# Patient Record
Sex: Female | Born: 1940 | Race: White | Hispanic: No | Smoking: Former smoker
Health system: Southern US, Community
[De-identification: ages and names within clinical notes are randomized; demographics above are authoritative.]

---

## 2002-11-23 ENCOUNTER — Encounter: Admission: RE | Admit: 2002-11-23 | Discharge: 2002-11-23 | Payer: Self-pay | Admitting: Neurosurgery

## 2002-12-08 ENCOUNTER — Encounter: Admission: RE | Admit: 2002-12-08 | Discharge: 2002-12-08 | Payer: Self-pay | Admitting: Neurosurgery

## 2011-11-10 DIAGNOSIS — I679 Cerebrovascular disease, unspecified: Secondary | ICD-10-CM | POA: Insufficient documentation

## 2011-11-11 DIAGNOSIS — E11319 Type 2 diabetes mellitus with unspecified diabetic retinopathy without macular edema: Secondary | ICD-10-CM | POA: Insufficient documentation

## 2012-05-12 DIAGNOSIS — F3342 Major depressive disorder, recurrent, in full remission: Secondary | ICD-10-CM | POA: Insufficient documentation

## 2012-12-24 DIAGNOSIS — M129 Arthropathy, unspecified: Secondary | ICD-10-CM | POA: Insufficient documentation

## 2013-02-06 DIAGNOSIS — E113299 Type 2 diabetes mellitus with mild nonproliferative diabetic retinopathy without macular edema, unspecified eye: Secondary | ICD-10-CM | POA: Insufficient documentation

## 2013-03-31 DIAGNOSIS — I350 Nonrheumatic aortic (valve) stenosis: Secondary | ICD-10-CM | POA: Insufficient documentation

## 2013-03-31 DIAGNOSIS — Z951 Presence of aortocoronary bypass graft: Secondary | ICD-10-CM | POA: Insufficient documentation

## 2013-09-04 DIAGNOSIS — D126 Benign neoplasm of colon, unspecified: Secondary | ICD-10-CM | POA: Insufficient documentation

## 2013-11-13 DIAGNOSIS — J302 Other seasonal allergic rhinitis: Secondary | ICD-10-CM | POA: Insufficient documentation

## 2013-11-13 DIAGNOSIS — J3089 Other allergic rhinitis: Secondary | ICD-10-CM

## 2015-01-15 DIAGNOSIS — IMO0002 Reserved for concepts with insufficient information to code with codable children: Secondary | ICD-10-CM | POA: Insufficient documentation

## 2015-01-15 DIAGNOSIS — G4733 Obstructive sleep apnea (adult) (pediatric): Secondary | ICD-10-CM | POA: Insufficient documentation

## 2015-05-11 DIAGNOSIS — R0989 Other specified symptoms and signs involving the circulatory and respiratory systems: Secondary | ICD-10-CM | POA: Insufficient documentation

## 2015-05-11 DIAGNOSIS — N200 Calculus of kidney: Secondary | ICD-10-CM | POA: Insufficient documentation

## 2015-05-11 DIAGNOSIS — F411 Generalized anxiety disorder: Secondary | ICD-10-CM | POA: Insufficient documentation

## 2015-05-11 DIAGNOSIS — K449 Diaphragmatic hernia without obstruction or gangrene: Secondary | ICD-10-CM

## 2015-05-11 DIAGNOSIS — K219 Gastro-esophageal reflux disease without esophagitis: Secondary | ICD-10-CM | POA: Insufficient documentation

## 2015-05-11 DIAGNOSIS — E782 Mixed hyperlipidemia: Secondary | ICD-10-CM | POA: Insufficient documentation

## 2015-05-13 DIAGNOSIS — K7460 Unspecified cirrhosis of liver: Secondary | ICD-10-CM | POA: Insufficient documentation

## 2015-05-24 DIAGNOSIS — C8330 Diffuse large B-cell lymphoma, unspecified site: Secondary | ICD-10-CM | POA: Insufficient documentation

## 2015-05-26 DIAGNOSIS — I480 Paroxysmal atrial fibrillation: Secondary | ICD-10-CM | POA: Insufficient documentation

## 2015-06-25 DIAGNOSIS — I5032 Chronic diastolic (congestive) heart failure: Secondary | ICD-10-CM | POA: Insufficient documentation

## 2016-06-30 DIAGNOSIS — R32 Unspecified urinary incontinence: Secondary | ICD-10-CM | POA: Insufficient documentation

## 2016-09-22 DIAGNOSIS — M818 Other osteoporosis without current pathological fracture: Secondary | ICD-10-CM | POA: Insufficient documentation

## 2017-04-11 DIAGNOSIS — E039 Hypothyroidism, unspecified: Secondary | ICD-10-CM | POA: Insufficient documentation

## 2017-04-21 ENCOUNTER — Encounter: Payer: Self-pay | Admitting: Family Medicine

## 2017-04-21 ENCOUNTER — Ambulatory Visit (INDEPENDENT_AMBULATORY_CARE_PROVIDER_SITE_OTHER): Payer: Medicare Other | Admitting: Family Medicine

## 2017-04-21 DIAGNOSIS — M545 Low back pain, unspecified: Secondary | ICD-10-CM

## 2017-04-21 DIAGNOSIS — M25551 Pain in right hip: Secondary | ICD-10-CM

## 2017-04-21 MED ORDER — METHYLPREDNISOLONE ACETATE 40 MG/ML IJ SUSP
40.0000 mg | Freq: Once | INTRAMUSCULAR | Status: AC
  Administered 2017-04-21: 40 mg via INTRAMUSCULAR

## 2017-04-21 NOTE — Patient Instructions (Signed)
I'm concerned you have an irritated nerve in your back going to your hip muscles. Our options are limited in what can be done to treat this. We gave you an injection today - expect your blood sugars to be elevated for the next week. We will also set up home physical therapy for you. Typically we consider an MRI of your back if not improving though I know you're very claustrophobic. Follow up with me about 1 month after starting therapy typically but you can call me sooner if you're struggling.

## 2017-04-22 DIAGNOSIS — M545 Low back pain, unspecified: Secondary | ICD-10-CM | POA: Insufficient documentation

## 2017-04-22 DIAGNOSIS — I10 Essential (primary) hypertension: Secondary | ICD-10-CM | POA: Insufficient documentation

## 2017-04-22 DIAGNOSIS — I6529 Occlusion and stenosis of unspecified carotid artery: Secondary | ICD-10-CM | POA: Insufficient documentation

## 2017-04-22 DIAGNOSIS — I739 Peripheral vascular disease, unspecified: Secondary | ICD-10-CM | POA: Insufficient documentation

## 2017-04-22 DIAGNOSIS — E669 Obesity, unspecified: Secondary | ICD-10-CM | POA: Insufficient documentation

## 2017-04-22 NOTE — Progress Notes (Signed)
PCP: Janett Billow, MD  Subjective:   HPI: Patient is a 76 y.o. female here for right hip pain.  Patient denies known injury or trauma. She states for the past 3 weeks she's had fairly severe posterior right hip, low back pain. Pain with walking, standing, and sitting. Only relief when laying flat with heating pad. Tried low dose muscle relaxant and 2 extra strength tylenol four times a day. Feels this started when she stood up after making the bed, turned and hip felt like it was on fire. Has known hip arthritis. Unfortunately cannot take many medications including prednisone - when takes this blood sugar goes up significantly and has trouble sleeping. No numbness or tingling. No bowel/bladder dysfunction.  No past medical history on file.  No current outpatient prescriptions on file prior to visit.   No current facility-administered medications on file prior to visit.     No past surgical history on file.  Allergies  Allergen Reactions  . Lisinopril Other (See Comments)    BAD COUGH  . Niacin And Related Other (See Comments)    Burning sensation  . Codeine Nausea And Vomiting  . Hydrocodone-Acetaminophen Nausea Only  . Tape Other (See Comments)    Skin peeling ---PAPER TAPE ONLY PLEASE  . Oxycodone-Acetaminophen Nausea And Vomiting  . Procaine Nausea Only and Palpitations    Heart racing-can use Lidocaine    Social History   Social History  . Marital status: Married    Spouse name: N/A  . Number of children: N/A  . Years of education: N/A   Occupational History  . Not on file.   Social History Main Topics  . Smoking status: Former Research scientist (life sciences)  . Smokeless tobacco: Never Used  . Alcohol use Not on file  . Drug use: Unknown  . Sexual activity: Not on file   Other Topics Concern  . Not on file   Social History Narrative  . No narrative on file    No family history on file.  BP 121/80   Pulse 67   Ht 5\' 3"  (1.6 m)   Wt 238 lb (108 kg)   BMI 42.16  kg/m   Review of Systems: See HPI above.     Objective:  Physical Exam:  Gen: NAD, comfortable in exam room seated in chair.  Back/right hip: No gross deformity, scoliosis. TTP right buttock, over hip external rotators.  No SI joint or midline back tenderness.  Minimal proximal IT band tenderness. FROM with pain on flexion of back. Strength 4/5 with hip flexion and knee extension on right.  5/5 all other muscle groups.   1+ MSRs in patellar and achilles tendons, equal bilaterally. Positive SLR on right, negative left. Sensation intact to light touch bilaterally. Negative logroll bilateral hips   Assessment & Plan:  1. Low back/right hip pain - We had a discussion regarding possibilities and unfortunately this fits most with lumbar radiculopathy given location, severity, and otherwise normal exam aside from straight leg raises and proximal lower leg weakness.  She is limited in what she can do for this condition.  Encouraged to continue with the tylenol and muscle relaxant.  We also discussed IM injection and that this would increase her blood sugars over about 7+ days - she is aware and wanted to go ahead with this.  Will order home health PT for her as well.  Consider MRI but she reports very claustrophobic.  F/u in 1 month.  After informed written consent timeout was performed.  Alcohol swab to prep right upper outer quadrant of glut then injected with 1:1 bupivicaine: depomedrol intramuscularly.  Patient tolerated procedure well without immediate complications.

## 2017-04-22 NOTE — Assessment & Plan Note (Signed)
We had a discussion regarding possibilities and unfortunately this fits most with lumbar radiculopathy given location, severity, and otherwise normal exam aside from straight leg raises and proximal lower leg weakness.  She is limited in what she can do for this condition.  Encouraged to continue with the tylenol and muscle relaxant.  We also discussed IM injection and that this would increase her blood sugars over about 7+ days - she is aware and wanted to go ahead with this.  Will order home health PT for her as well.  Consider MRI but she reports very claustrophobic.  F/u in 1 month.  After informed written consent timeout was performed.  Alcohol swab to prep right upper outer quadrant of glut then injected with 1:1 bupivicaine: depomedrol intramuscularly.  Patient tolerated procedure well without immediate complications.

## 2017-04-28 ENCOUNTER — Telehealth: Payer: Self-pay | Admitting: Family Medicine

## 2017-04-28 NOTE — Telephone Encounter (Signed)
Informed Sandra Poole that plan of care is approved per provider

## 2017-04-28 NOTE — Telephone Encounter (Signed)
Ok to approve this.  Thanks!

## 2017-04-28 NOTE — Telephone Encounter (Signed)
Requesting a verbal order for physical therapy plan of care:  1x a week for 1wk, 2x a week for 2wks, and then 1x a week for 1wk

## 2017-05-24 ENCOUNTER — Ambulatory Visit: Payer: Medicare Other | Admitting: Family Medicine

## 2017-05-24 ENCOUNTER — Encounter: Payer: Self-pay | Admitting: Family Medicine

## 2017-05-24 DIAGNOSIS — M545 Low back pain, unspecified: Secondary | ICD-10-CM

## 2017-05-24 NOTE — Patient Instructions (Signed)
Call me if you need anything! I'm glad you're doing better!

## 2017-05-25 ENCOUNTER — Encounter: Payer: Self-pay | Admitting: Family Medicine

## 2017-05-25 NOTE — Assessment & Plan Note (Signed)
Consistent with lumbar radiculopathy.  Improved with IM injection, home PT and exercises.  Encouraged to do home exercises still.  Tylenol if needed.  F/u prn.

## 2017-05-25 NOTE — Progress Notes (Signed)
PCP: Janett Billow, MD  Subjective:   HPI: Patient is a 76 y.o. female here for right hip pain.  10/3: Patient denies known injury or trauma. She states for the past 3 weeks she's had fairly severe posterior right hip, low back pain. Pain with walking, standing, and sitting. Only relief when laying flat with heating pad. Tried low dose muscle relaxant and 2 extra strength tylenol four times a day. Feels this started when she stood up after making the bed, turned and hip felt like it was on fire. Has known hip arthritis. Unfortunately cannot take many medications including prednisone - when takes this blood sugar goes up significantly and has trouble sleeping. No numbness or tingling. No bowel/bladder dysfunction.  11/5: Patient reports she feels much better following 2-3 days after injection. Did 2 visits of home physical therapy and home exercises then advised PT he did not have to come in any more due to her improvement. No numbness. No pain currently.  History reviewed. No pertinent past medical history.  Current Outpatient Medications on File Prior to Visit  Medication Sig Dispense Refill  . ALPRAZolam (XANAX) 0.5 MG tablet     . atorvastatin (LIPITOR) 80 MG tablet     . buPROPion (WELLBUTRIN XL) 150 MG 24 hr tablet     . cloNIDine (CATAPRES) 0.2 MG tablet     . furosemide (LASIX) 40 MG tablet     . HUMALOG 100 UNIT/ML injection     . levothyroxine (SYNTHROID, LEVOTHROID) 50 MCG tablet     . losartan (COZAAR) 100 MG tablet     . metFORMIN (GLUCOPHAGE) 500 MG tablet     . metoprolol succinate (TOPROL-XL) 100 MG 24 hr tablet     . oxybutynin (DITROPAN) 5 MG tablet     . pantoprazole (PROTONIX) 40 MG tablet     . sertraline (ZOLOFT) 50 MG tablet     . tiZANidine (ZANAFLEX) 4 MG tablet     . XARELTO 20 MG TABS tablet      No current facility-administered medications on file prior to visit.     History reviewed. No pertinent surgical history.  Allergies  Allergen  Reactions  . Lisinopril Other (See Comments)    BAD COUGH  . Niacin And Related Other (See Comments)    Burning sensation  . Codeine Nausea And Vomiting  . Hydrocodone-Acetaminophen Nausea Only  . Tape Other (See Comments)    Skin peeling ---PAPER TAPE ONLY PLEASE  . Oxycodone-Acetaminophen Nausea And Vomiting  . Procaine Nausea Only and Palpitations    Heart racing-can use Lidocaine    Social History   Socioeconomic History  . Marital status: Married    Spouse name: Not on file  . Number of children: Not on file  . Years of education: Not on file  . Highest education level: Not on file  Social Needs  . Financial resource strain: Not on file  . Food insecurity - worry: Not on file  . Food insecurity - inability: Not on file  . Transportation needs - medical: Not on file  . Transportation needs - non-medical: Not on file  Occupational History  . Not on file  Tobacco Use  . Smoking status: Former Research scientist (life sciences)  . Smokeless tobacco: Never Used  Substance and Sexual Activity  . Alcohol use: Not on file  . Drug use: Not on file  . Sexual activity: Not on file  Other Topics Concern  . Not on file  Social History Narrative  .  Not on file    History reviewed. No pertinent family history.  BP 119/83   Pulse 71   Ht 5\' 3"  (1.6 m)   Wt 235 lb (106.6 kg)   BMI 41.63 kg/m   Review of Systems: See HPI above.     Objective:  Physical Exam:  Gen: NAD, seated in wheelchair.  Back/right hip: No gross deformity, scoliosis. No TTP back, buttock, or hip.  No midline or bony TTP. FROM without pain. Strength LEs 5/5 all muscle groups.   Negative SLRs. Sensation intact to light touch bilaterally. Negative logroll bilateral hips   Assessment & Plan:  1. Low back/right hip pain - Consistent with lumbar radiculopathy.  Improved with IM injection, home PT and exercises.  Encouraged to do home exercises still.  Tylenol if needed.  F/u prn.

## 2021-12-03 ENCOUNTER — Ambulatory Visit: Payer: Medicare Other | Admitting: Family Medicine

## 2021-12-03 ENCOUNTER — Encounter: Payer: Self-pay | Admitting: Family Medicine

## 2021-12-03 VITALS — BP 140/41 | Ht 62.0 in | Wt 209.0 lb

## 2021-12-03 DIAGNOSIS — M5416 Radiculopathy, lumbar region: Secondary | ICD-10-CM | POA: Diagnosis not present

## 2021-12-03 MED ORDER — OXYCODONE-ACETAMINOPHEN 5-325 MG PO TABS: 1.0000 | ORAL_TABLET | Freq: Four times a day (QID) | ORAL | 0 refills | Status: DC | PRN

## 2021-12-03 MED ORDER — DIAZEPAM 5 MG PO TABS: ORAL_TABLET | ORAL | 0 refills | Status: AC

## 2021-12-03 MED ORDER — PREDNISONE 10 MG PO TABS: ORAL_TABLET | ORAL | 0 refills | Status: AC

## 2021-12-03 NOTE — Patient Instructions (Addendum)
You have lumbar radiculopathy (a pinched nerve in your low back). ?Don't take tylenol with your oxycodone (this has tylenol in it). ?Take prednisone dose pack as directed. ?Oxycodone as needed for severe pain (no driving on this medicine). ?We will order an MRI of your lumbar spine - take valium 45 minutes prior to this, can repeat once more about 15 minutes before if you still feel anxious. ?Stay as active as possible. ?Physical therapy has been shown to be helpful as well but I don't think you would tolerate this right now. ?I will call you with the MRI results and next steps. ? ? ?[facility] ?[address], [place] [address] ?Phone: [phone] ? ?Please call them to schedule your MRI. ?

## 2021-12-03 NOTE — Progress Notes (Signed)
PCP: Janett Billow, MD ? ?Subjective:  ? ?HPI: ?Patient is a 81 y.o. female here for right leg pain. ? ?Patient reports she started to get pain in right posterior hip radiating down right leg in December. ?She was given shot of steroid, oral steroids, and percocet as needed. ?She did well following this for about a month then pain has slowly worsened since then. ?Pain sharp and radiates from posterior right hip wrapping around thigh to anterior knee down leg to all digits. ?Associated numbness, tingling like foot is asleep. ?No changes in bowel/bladder function. ?Difficulty lifting leg up off the chair. ? ?History reviewed. No pertinent past medical history. ? ?Current Outpatient Medications on File Prior to Visit  ?Medication Sig Dispense Refill  ? ALPRAZolam (XANAX) 0.5 MG tablet     ? atorvastatin (LIPITOR) 80 MG tablet     ? buPROPion (WELLBUTRIN XL) 150 MG 24 hr tablet     ? cloNIDine (CATAPRES) 0.2 MG tablet     ? furosemide (LASIX) 40 MG tablet     ? HUMALOG 100 UNIT/ML injection     ? levothyroxine (SYNTHROID, LEVOTHROID) 50 MCG tablet     ? losartan (COZAAR) 100 MG tablet     ? metFORMIN (GLUCOPHAGE) 500 MG tablet     ? metoprolol succinate (TOPROL-XL) 100 MG 24 hr tablet     ? oxybutynin (DITROPAN) 5 MG tablet     ? pantoprazole (PROTONIX) 40 MG tablet     ? sertraline (ZOLOFT) 50 MG tablet     ? tiZANidine (ZANAFLEX) 4 MG tablet     ? XARELTO 20 MG TABS tablet     ? ?No current facility-administered medications on file prior to visit.  ? ? ?History reviewed. No pertinent surgical history. ? ?Allergies  ?Allergen Reactions  ? Lisinopril Other (See Comments)  ?  BAD COUGH  ? Niacin And Related Other (See Comments)  ?  Burning sensation  ? Codeine Nausea And Vomiting  ? Hydrocodone-Acetaminophen Nausea Only  ? Tape Other (See Comments)  ?  Skin peeling ---PAPER TAPE ONLY PLEASE  ? Oxycodone-Acetaminophen Nausea And Vomiting  ? Procaine Nausea Only and Palpitations  ?  Heart racing-can use Lidocaine   ? ? ?BP (!) 140/41   Ht '5\' 2"'$  (1.575 m)   Wt 209 lb (94.8 kg)   BMI 38.23 kg/m?  ? ?   ? View : No data to display.  ?  ?  ?  ? ? ?   ? View : No data to display.  ?  ?  ?  ? ? ?    ?Objective:  ?Physical Exam: ? ?Gen: NAD, comfortable in exam room ? ?Back: ?No gross deformity, scoliosis. ?TTP right low paraspinal lumbar region, gluteal musculature.  No midline or bony TTP. ?ROM limited due to pain. ?Strength LEs 5/5 all muscle groups except 3/5 right hip flexion, 4/5 right knee extension.   ?Trace MSRs in patellar and achilles tendons, equal bilaterally. ?Positive SLR on right, negative left. ?Sensation intact to light touch bilaterally. ?  ?Right hip: ?Negative logroll ? ?Assessment & Plan:  ?1. Lumbar radiculopathy - right side possibly multiple nerve root involvement including L3, 4, 5.  Had about 1 month of relief with steroids + percocet.  Will repeat oral steroid dose pack (at lower dose with her history of a-fib) and percocet as needed.  Will go ahead with MRI of lumbar spine - she is very claustrophobic so will provide 2 tabs of valium.  Database reviewed.  Consider epidural steroid injection as next step following MRI. ?

## 2021-12-06 ENCOUNTER — Ambulatory Visit (HOSPITAL_BASED_OUTPATIENT_CLINIC_OR_DEPARTMENT_OTHER)
Admission: RE | Admit: 2021-12-06 | Discharge: 2021-12-06 | Disposition: A | Discharge status: Home / Self Care | Payer: Medicare Other | Source: Ambulatory Visit | Attending: Family Medicine | Admitting: Family Medicine

## 2021-12-06 DIAGNOSIS — M5416 Radiculopathy, lumbar region: Secondary | ICD-10-CM

## 2021-12-09 ENCOUNTER — Other Ambulatory Visit: Payer: Self-pay

## 2021-12-09 DIAGNOSIS — M5416 Radiculopathy, lumbar region: Secondary | ICD-10-CM

## 2021-12-10 ENCOUNTER — Other Ambulatory Visit: Payer: Self-pay | Admitting: Family Medicine

## 2021-12-10 ENCOUNTER — Other Ambulatory Visit: Payer: Self-pay | Admitting: Cardiology

## 2021-12-10 DIAGNOSIS — M5416 Radiculopathy, lumbar region: Secondary | ICD-10-CM

## 2021-12-12 ENCOUNTER — Telehealth: Payer: Self-pay | Admitting: Family Medicine

## 2021-12-12 NOTE — Telephone Encounter (Signed)
Pt called request phone# to facility she is suppose to have Facet injection or treatment to be done at(says no on has called her).  --- Pt's daughter req office to contact pt's Son/ Richy  @ [phone] to give that name & phone# to that facility.

## 2021-12-18 ENCOUNTER — Ambulatory Visit
Admission: RE | Admit: 2021-12-18 | Discharge: 2021-12-18 | Disposition: A | Discharge status: Home / Self Care | Payer: Medicare Other | Source: Ambulatory Visit | Attending: Family Medicine | Admitting: Family Medicine

## 2021-12-18 DIAGNOSIS — M5416 Radiculopathy, lumbar region: Secondary | ICD-10-CM

## 2021-12-18 MED ORDER — IOPAMIDOL (ISOVUE-M 200) INJECTION 41%
1.0000 mL | Freq: Once | INTRAMUSCULAR | Status: AC
  Administered 2021-12-18: 1 mL via EPIDURAL

## 2021-12-18 MED ORDER — METHYLPREDNISOLONE ACETATE 40 MG/ML INJ SUSP (RADIOLOG
80.0000 mg | Freq: Once | INTRAMUSCULAR | Status: AC
  Administered 2021-12-18: 80 mg via EPIDURAL

## 2021-12-18 NOTE — Discharge Instructions (Signed)

## 2021-12-29 ENCOUNTER — Telehealth: Payer: Self-pay | Admitting: Family Medicine

## 2021-12-29 MED ORDER — OXYCODONE-ACETAMINOPHEN 5-325 MG PO TABS: 1.0000 | ORAL_TABLET | Freq: Four times a day (QID) | ORAL | 0 refills | Status: AC | PRN

## 2021-12-29 NOTE — Telephone Encounter (Signed)
Sent in percocet - despite being on allergy list for nausea, patient tolerated in past.

## 2021-12-29 NOTE — Progress Notes (Signed)
Pt's son called stating his mom is still having a lot of leg/hip pain. Asking for advice. Per Dr. Barbaraann Barthel - since she didn't get relief with the [facility] we will refer to neurosurgery. They will contact her to schedule an appt. In the meantime he'll call in a short course of percocet for severe pain.  Pt's son understands and agrees with the plan.

## 2021-12-29 NOTE — Telephone Encounter (Signed)
-----   Message from [facility], LAT sent at 12/29/2021  3:29 PM EDT ----- Regarding: RE: phone message Pt's son agreeable to neurosurgery referral and percocet. Please send Rx to pt's pharmacy. I'll send referral to [facility]. Thanks!  ----- Message ----- From: Dene Gentry, MD Sent: 12/29/2021   3:20 PM EDT To: Jolinda Croak, LAT Subject: RE: phone message                              Unfortunately if the shot didn't help her that much I would recommend referral to neurosurgery.  We can prescribe a short course of percocet in the meantime to help with her pain if she's ok with this.  ----- Message ----- From: Jolinda Croak, LAT Sent: 12/29/2021   1:54 PM EDT To: Dene Gentry, MD Subject: FW: phone message                               ----- Message ----- From: Carolyne Littles Sent: 12/29/2021   1:42 PM EDT To: Jolinda Croak, LAT Subject: FW: phone message                              Pt son (richie) called asking for a call back at [phone]. His mom is in a lot of pain. It says in the [place] she has signed somewhere within [facility] to get PHI.  ----- Message ----- From: Carolyne Littles Sent: 12/29/2021   8:47 AM EDT To: Jolinda Croak, LAT Subject: phone message                                  Pt called with update, she is still having leg/hip pain. Asking for a call back to discuss.

## 2022-10-26 IMAGING — MR MR LUMBAR SPINE W/O CM
4 of 5 series · 26 of 48 positions shown · non-contrast
Comparison: None Available.

CLINICAL DATA: Lumbar radiculopathy, symptoms persist with > 6 wks
treatment

EXAM:
MRI LUMBAR SPINE WITHOUT CONTRAST
TECHNIQUE: Multiplanar, multisequence MR imaging of the lumbar spine was
performed. No intravenous contrast was administered.

[Series 3: T1 · sagittal · 4.0mm · 0.81mm/px · 6 of 14 slices shown (1 of 2)]
[im 1/14]
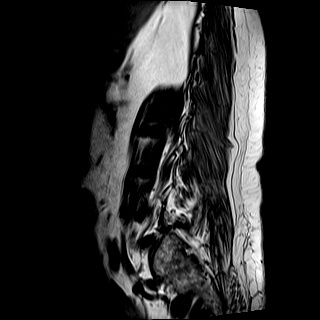
[im 3/14]
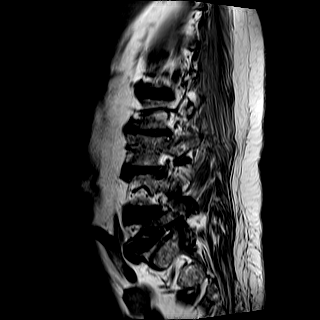
[im 6/14]
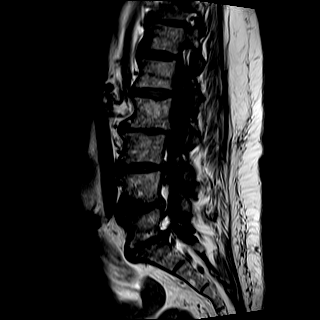
[im 8/14]
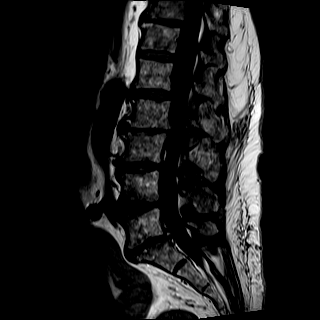
[im 11/14]
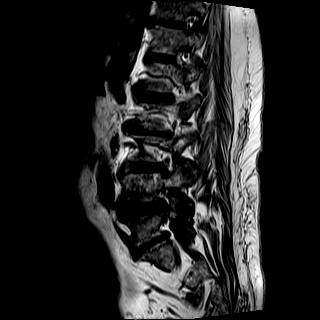
[im 14/14]
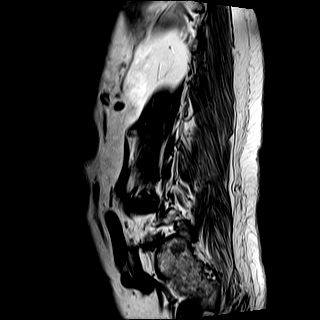

[Series 4: T2 · sagittal · 4.0mm · 0.81mm/px · 7 of 14 slices shown (1 of 2)]
[im 1/14]
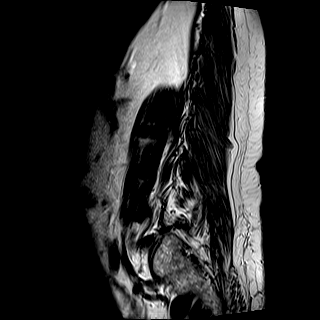
[im 3/14]
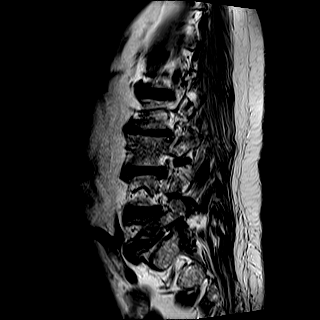
[im 5/14]
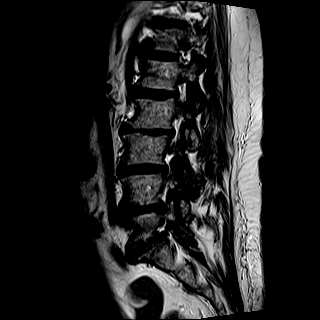
[im 7/14]
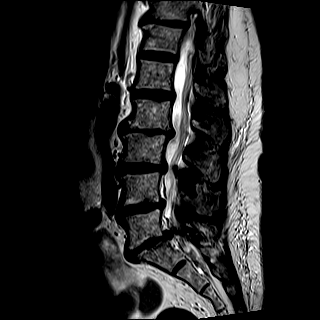
[im 9/14]
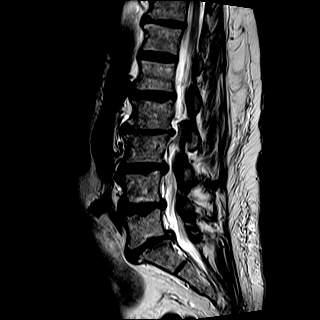
[im 11/14]
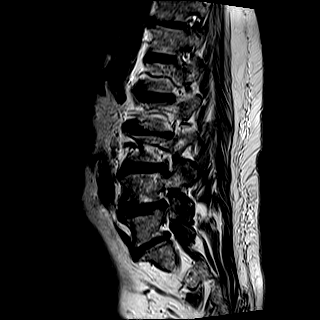
[im 14/14]
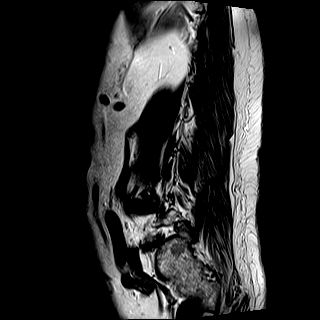

[Series 6: T2 · axial · 4.0mm · 0.39mm/px · z∈[+24,+184]mm · 8 of 27 slices shown (2 of 2)]
[im 1/27]
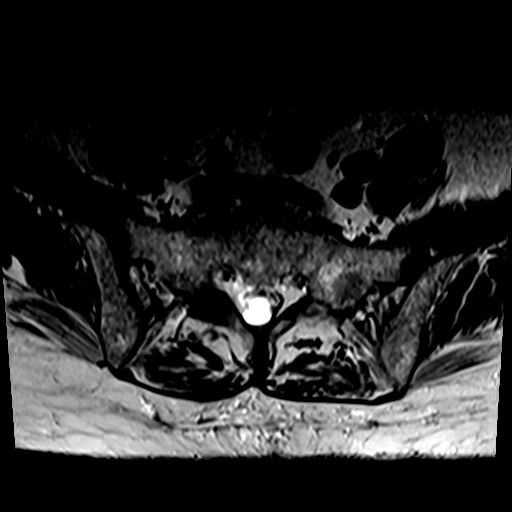
[im 5/27]
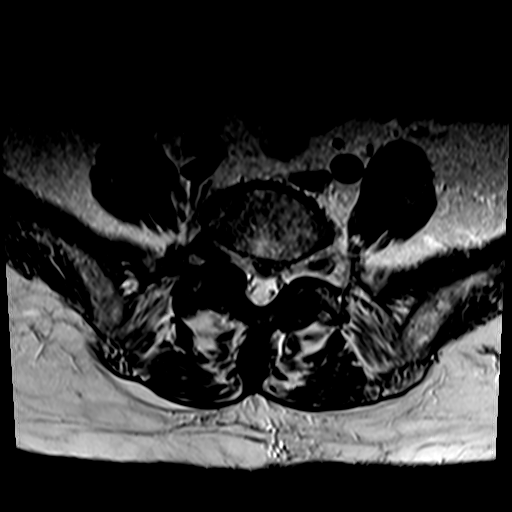
[im 9/27]
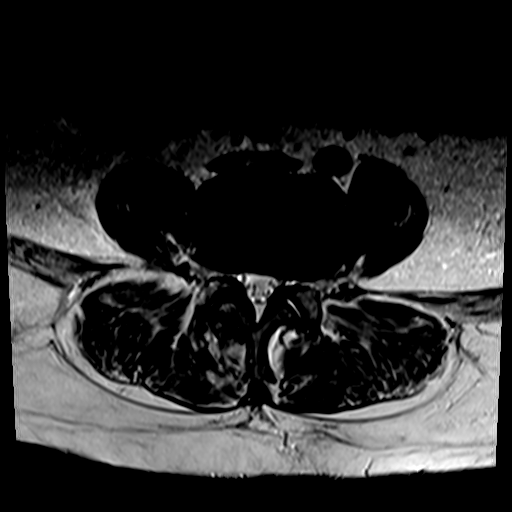
[im 13/27]
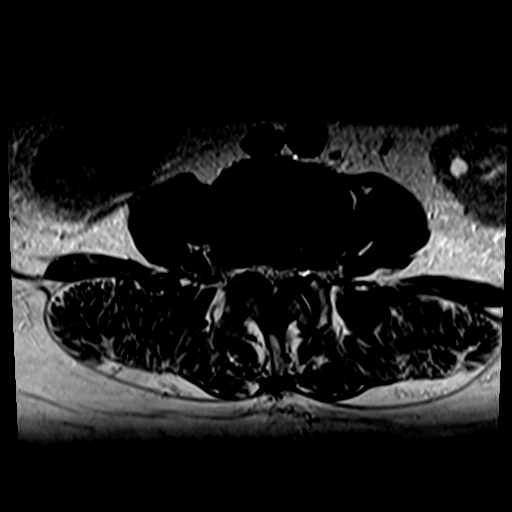
[im 15/27]
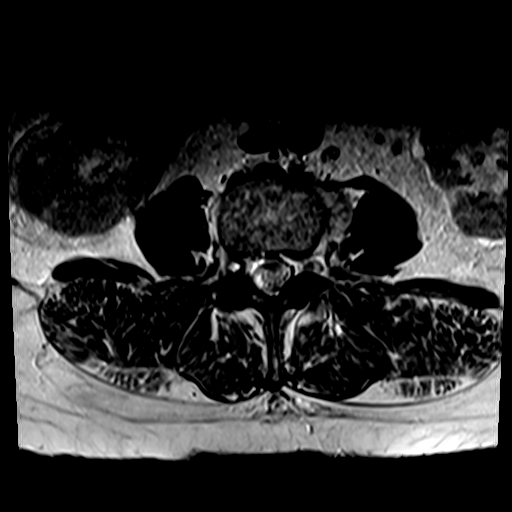
[im 19/27]
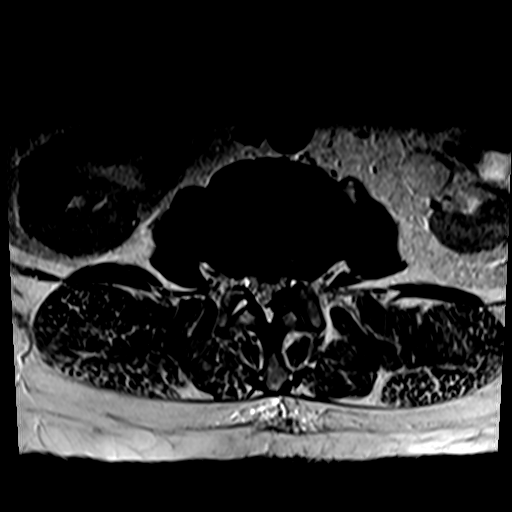
[im 23/27]
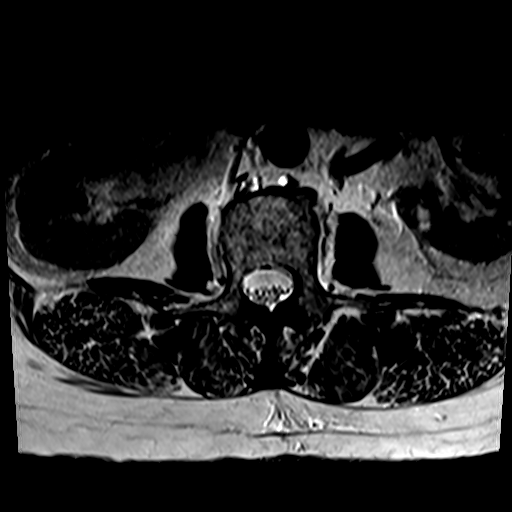
[im 27/27]
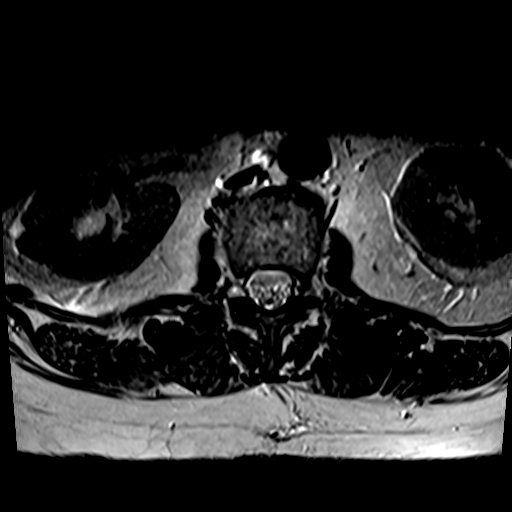

[Series 7: T1 · axial · 4.0mm · 0.39mm/px · z∈[+24,+164]mm · 5 of 27 slices shown (2 of 2)]
[im 1/27]
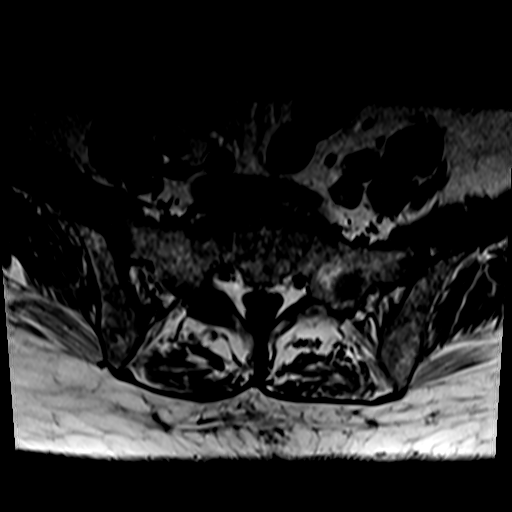
[im 5/27]
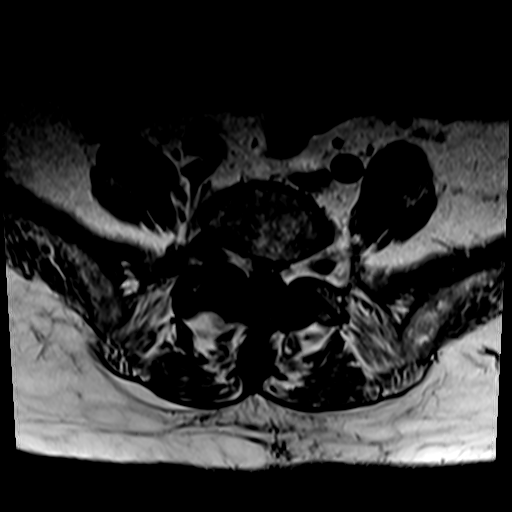
[im 9/27]
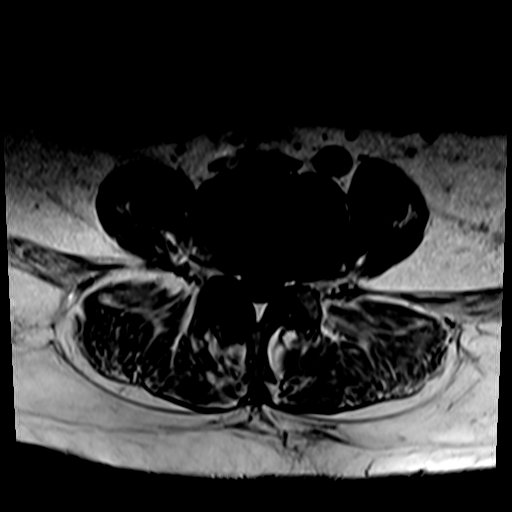
[im 15/27]
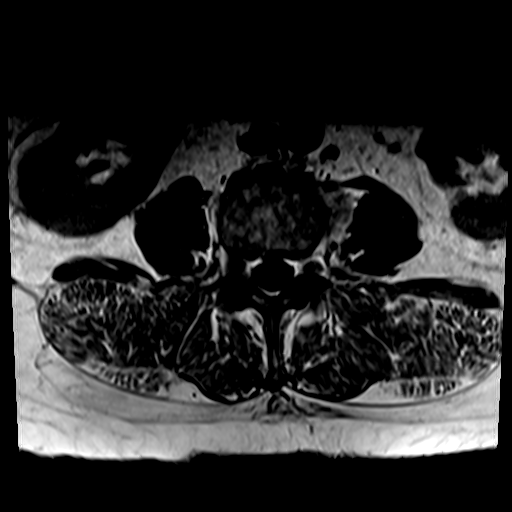
[im 23/27]
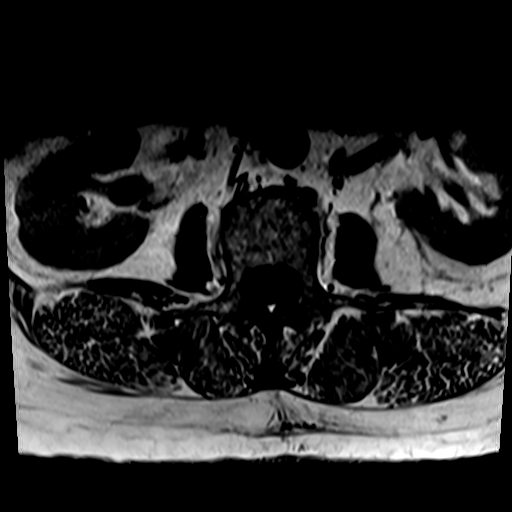

[26 of 48 positions shown; findings below may reference images not displayed]

FINDINGS: Segmentation:  Standard.

Alignment: Minimal grade 1 anterolisthesis of L5 on S1. Trace
retrolisthesis at L2-3.

Vertebrae: No fracture, evidence of discitis, or bone lesion.
Discogenic endplate marrow edema at L4-5 and to a lesser degree
L5-S1.

Conus medullaris and cauda equina: Conus extends to the L1 level.
Conus and cauda equina appear normal.

Paraspinal and other soft tissues: Cortically based T2 hyperintense
lesionswithin the bilateral kidneys, incompletely characterized, but
most likely represent cysts.

Disc levels:

T12-L1: No significant disc protrusion, foraminal stenosis, or canal
stenosis.

L1-L2: Minimal annular disc bulge. Mild bilateral facet arthropathy.
No foraminal or canal stenosis.

L2-L3: Disc height loss with mild diffuse disc bulge and endplate
ridging. Minimal facet hypertrophy. Mild canal stenosis with mild
left foraminal stenosis.

L3-L4: Diffuse disc bulge with shallow central disc protrusion.
Moderate bilateral facet arthropathy with ligamentum flavum
buckling. Findings result in moderate canal stenosis with mild
bilateral foraminal stenosis.

L4-L5: Broad base disc bulge with mild-moderate bilateral facet
arthropathy and ligamentum flavum buckling. No canal stenosis. Mild
bilateral foraminal stenosis.

L5-S1: Disc height loss with shallow central disc protrusion and
endplate osteophytic ridging. Mild-to-moderate bilateral facet
arthropathy. Mild bilateral subarticular recess stenosis without
canal stenosis. Moderate to severe right and mild left foraminal
stenosis.
IMPRESSION: 1. Multilevel lumbar spondylosis resulting in moderate canal
stenosis at L3-4 and mild canal stenosis at L2-3.
2. Moderate-to-severe right foraminal stenosis at L5-S1.
